# Patient Record
Sex: Male | Born: 1970 | Race: White | Hispanic: No | Marital: Single | Smoking: Current some day smoker
Health system: Southern US, Community
[De-identification: ages and names within clinical notes are randomized; demographics above are authoritative.]

## PROBLEM LIST (undated history)

## (undated) DIAGNOSIS — IMO0002 Reserved for concepts with insufficient information to code with codable children: Secondary | ICD-10-CM

## (undated) DIAGNOSIS — E1165 Type 2 diabetes mellitus with hyperglycemia: Secondary | ICD-10-CM

## (undated) DIAGNOSIS — E119 Type 2 diabetes mellitus without complications: Secondary | ICD-10-CM

## (undated) DIAGNOSIS — E162 Hypoglycemia, unspecified: Secondary | ICD-10-CM

## (undated) DIAGNOSIS — G2581 Restless legs syndrome: Secondary | ICD-10-CM

## (undated) DIAGNOSIS — L02611 Cutaneous abscess of right foot: Secondary | ICD-10-CM

## (undated) DIAGNOSIS — E1140 Type 2 diabetes mellitus with diabetic neuropathy, unspecified: Secondary | ICD-10-CM

## (undated) DIAGNOSIS — F1990 Other psychoactive substance use, unspecified, uncomplicated: Secondary | ICD-10-CM

## (undated) DIAGNOSIS — F329 Major depressive disorder, single episode, unspecified: Secondary | ICD-10-CM

## (undated) DIAGNOSIS — F319 Bipolar disorder, unspecified: Secondary | ICD-10-CM

## (undated) DIAGNOSIS — Z9119 Patient's noncompliance with other medical treatment and regimen: Secondary | ICD-10-CM

## (undated) DIAGNOSIS — A4902 Methicillin resistant Staphylococcus aureus infection, unspecified site: Secondary | ICD-10-CM

## (undated) HISTORY — DX: Major depressive disorder, single episode, unspecified: F32.9

## (undated) HISTORY — DX: Type 2 diabetes mellitus with hyperglycemia: E11.65

## (undated) HISTORY — DX: Cutaneous abscess of right foot: L02.611

## (undated) HISTORY — DX: Methicillin resistant Staphylococcus aureus infection, unspecified site: A49.02

## (undated) HISTORY — DX: Patient's noncompliance with other medical treatment and regimen: Z91.19

## (undated) HISTORY — DX: Hypoglycemia, unspecified: E16.2

## (undated) HISTORY — DX: Other psychoactive substance use, unspecified, uncomplicated: F19.90

## (undated) HISTORY — DX: Restless legs syndrome: G25.81

## (undated) HISTORY — DX: Type 2 diabetes mellitus with diabetic neuropathy, unspecified: E11.40

## (undated) HISTORY — DX: Bipolar disorder, unspecified: F31.9

## (undated) HISTORY — DX: Reserved for concepts with insufficient information to code with codable children: IMO0002

---

## 2005-01-14 ENCOUNTER — Emergency Department (HOSPITAL_COMMUNITY): Admission: EM | Admit: 2005-01-14 | Discharge: 2005-01-14 | Payer: Self-pay | Admitting: Emergency Medicine

## 2008-05-30 ENCOUNTER — Ambulatory Visit: Payer: Self-pay | Admitting: Family Medicine

## 2008-05-30 DIAGNOSIS — E1165 Type 2 diabetes mellitus with hyperglycemia: Secondary | ICD-10-CM

## 2008-05-30 DIAGNOSIS — F329 Major depressive disorder, single episode, unspecified: Secondary | ICD-10-CM

## 2008-05-30 DIAGNOSIS — F3289 Other specified depressive episodes: Secondary | ICD-10-CM

## 2008-05-30 DIAGNOSIS — F319 Bipolar disorder, unspecified: Secondary | ICD-10-CM

## 2008-05-30 DIAGNOSIS — IMO0001 Reserved for inherently not codable concepts without codable children: Secondary | ICD-10-CM

## 2008-05-30 HISTORY — DX: Bipolar disorder, unspecified: F31.9

## 2008-05-30 HISTORY — DX: Major depressive disorder, single episode, unspecified: F32.9

## 2008-05-30 HISTORY — DX: Other specified depressive episodes: F32.89

## 2008-05-30 HISTORY — DX: Reserved for inherently not codable concepts without codable children: IMO0001

## 2008-06-02 ENCOUNTER — Telehealth (INDEPENDENT_AMBULATORY_CARE_PROVIDER_SITE_OTHER): Payer: Self-pay | Admitting: *Deleted

## 2008-06-02 LAB — CONVERTED CEMR LAB
ALT: 55 units/L — ABNORMAL HIGH (ref 0–53)
Albumin: 3.9 g/dL (ref 3.5–5.2)
Alkaline Phosphatase: 66 units/L (ref 39–117)
BUN: 19 mg/dL (ref 6–23)
Bilirubin, Direct: 0.1 mg/dL (ref 0.0–0.3)
CO2: 29 meq/L (ref 19–32)
Calcium: 9.8 mg/dL (ref 8.4–10.5)
Eosinophils Relative: 1.6 % (ref 0.0–5.0)
GFR calc Af Amer: 89 mL/min
Glucose, Bld: 414 mg/dL — ABNORMAL HIGH (ref 70–99)
HCT: 49.7 % (ref 39.0–52.0)
Hemoglobin: 17.3 g/dL — ABNORMAL HIGH (ref 13.0–17.0)
Lymphocytes Relative: 20.5 % (ref 12.0–46.0)
Monocytes Absolute: 0.6 10*3/uL (ref 0.1–1.0)
Monocytes Relative: 9.7 % (ref 3.0–12.0)
Neutro Abs: 4.5 10*3/uL (ref 1.4–7.7)
Potassium: 5 meq/L (ref 3.5–5.1)
RBC: 5.53 M/uL (ref 4.22–5.81)
RDW: 11.2 % — ABNORMAL LOW (ref 11.5–14.6)
Sodium: 134 meq/L — ABNORMAL LOW (ref 135–145)
Total CHOL/HDL Ratio: 12.8
Total Protein: 7.2 g/dL (ref 6.0–8.3)
VLDL: 86 mg/dL — ABNORMAL HIGH (ref 0–40)
WBC: 6.5 10*3/uL (ref 4.5–10.5)

## 2008-06-03 ENCOUNTER — Telehealth: Payer: Self-pay | Admitting: Family Medicine

## 2008-06-06 ENCOUNTER — Encounter: Payer: Self-pay | Admitting: Family Medicine

## 2008-06-09 ENCOUNTER — Encounter: Payer: Self-pay | Admitting: Family Medicine

## 2008-06-17 ENCOUNTER — Encounter: Payer: Self-pay | Admitting: Family Medicine

## 2008-11-30 ENCOUNTER — Emergency Department (HOSPITAL_COMMUNITY): Admission: EM | Admit: 2008-11-30 | Discharge: 2008-11-30 | Payer: Self-pay | Admitting: Emergency Medicine

## 2010-10-19 LAB — DIFFERENTIAL
Basophils Absolute: 0 10*3/uL (ref 0.0–0.1)
Basophils Relative: 0 % (ref 0–1)
Eosinophils Absolute: 0.1 10*3/uL (ref 0.0–0.7)
Eosinophils Relative: 2 % (ref 0–5)
Neutrophils Relative %: 60 % (ref 43–77)

## 2010-10-19 LAB — BASIC METABOLIC PANEL
BUN: 8 mg/dL (ref 6–23)
Chloride: 101 mEq/L (ref 96–112)
Creatinine, Ser: 1.06 mg/dL (ref 0.4–1.5)
Glucose, Bld: 415 mg/dL — ABNORMAL HIGH (ref 70–99)
Potassium: 3.8 mEq/L (ref 3.5–5.1)

## 2010-10-19 LAB — CBC
HCT: 39.8 % (ref 39.0–52.0)
MCHC: 34.6 g/dL (ref 30.0–36.0)
MCV: 89.7 fL (ref 78.0–100.0)
Platelets: 246 10*3/uL (ref 150–400)
RDW: 12.4 % (ref 11.5–15.5)

## 2012-06-16 DIAGNOSIS — Z91199 Patient's noncompliance with other medical treatment and regimen due to unspecified reason: Secondary | ICD-10-CM

## 2012-06-16 DIAGNOSIS — IMO0002 Reserved for concepts with insufficient information to code with codable children: Secondary | ICD-10-CM

## 2012-06-16 DIAGNOSIS — Z9119 Patient's noncompliance with other medical treatment and regimen: Secondary | ICD-10-CM

## 2012-06-16 DIAGNOSIS — F1990 Other psychoactive substance use, unspecified, uncomplicated: Secondary | ICD-10-CM

## 2012-06-16 HISTORY — DX: Other psychoactive substance use, unspecified, uncomplicated: F19.90

## 2012-06-16 HISTORY — DX: Patient's noncompliance with other medical treatment and regimen due to unspecified reason: Z91.199

## 2012-06-16 HISTORY — DX: Patient's noncompliance with other medical treatment and regimen: Z91.19

## 2012-06-16 HISTORY — DX: Reserved for concepts with insufficient information to code with codable children: IMO0002

## 2012-06-21 DIAGNOSIS — A4902 Methicillin resistant Staphylococcus aureus infection, unspecified site: Secondary | ICD-10-CM

## 2012-06-21 HISTORY — DX: Methicillin resistant Staphylococcus aureus infection, unspecified site: A49.02

## 2012-08-10 DIAGNOSIS — E1140 Type 2 diabetes mellitus with diabetic neuropathy, unspecified: Secondary | ICD-10-CM

## 2012-08-10 HISTORY — DX: Type 2 diabetes mellitus with diabetic neuropathy, unspecified: E11.40

## 2012-08-14 DIAGNOSIS — G2581 Restless legs syndrome: Secondary | ICD-10-CM

## 2012-08-14 HISTORY — DX: Restless legs syndrome: G25.81

## 2013-01-20 DIAGNOSIS — E162 Hypoglycemia, unspecified: Secondary | ICD-10-CM

## 2013-01-20 HISTORY — DX: Hypoglycemia, unspecified: E16.2

## 2016-08-11 DIAGNOSIS — L02611 Cutaneous abscess of right foot: Secondary | ICD-10-CM

## 2016-08-11 HISTORY — DX: Cutaneous abscess of right foot: L02.611

## 2017-03-29 ENCOUNTER — Encounter (HOSPITAL_BASED_OUTPATIENT_CLINIC_OR_DEPARTMENT_OTHER): Payer: Self-pay | Admitting: Emergency Medicine

## 2017-03-29 ENCOUNTER — Emergency Department (HOSPITAL_BASED_OUTPATIENT_CLINIC_OR_DEPARTMENT_OTHER): Payer: BLUE CROSS/BLUE SHIELD

## 2017-03-29 ENCOUNTER — Emergency Department (HOSPITAL_BASED_OUTPATIENT_CLINIC_OR_DEPARTMENT_OTHER)
Admission: EM | Admit: 2017-03-29 | Discharge: 2017-03-29 | Disposition: A | Discharge status: Home / Self Care | Payer: BLUE CROSS/BLUE SHIELD | Attending: Emergency Medicine | Admitting: Emergency Medicine

## 2017-03-29 DIAGNOSIS — E119 Type 2 diabetes mellitus without complications: Secondary | ICD-10-CM | POA: Insufficient documentation

## 2017-03-29 DIAGNOSIS — R072 Precordial pain: Secondary | ICD-10-CM | POA: Diagnosis not present

## 2017-03-29 DIAGNOSIS — Z794 Long term (current) use of insulin: Secondary | ICD-10-CM | POA: Insufficient documentation

## 2017-03-29 DIAGNOSIS — F17200 Nicotine dependence, unspecified, uncomplicated: Secondary | ICD-10-CM | POA: Insufficient documentation

## 2017-03-29 DIAGNOSIS — R200 Anesthesia of skin: Secondary | ICD-10-CM | POA: Insufficient documentation

## 2017-03-29 DIAGNOSIS — R079 Chest pain, unspecified: Secondary | ICD-10-CM | POA: Diagnosis present

## 2017-03-29 DIAGNOSIS — Z79899 Other long term (current) drug therapy: Secondary | ICD-10-CM | POA: Diagnosis not present

## 2017-03-29 HISTORY — DX: Type 2 diabetes mellitus without complications: E11.9

## 2017-03-29 LAB — CBG MONITORING, ED: GLUCOSE-CAPILLARY: 305 mg/dL — AB (ref 65–99)

## 2017-03-29 LAB — COMPREHENSIVE METABOLIC PANEL
ALBUMIN: 4.2 g/dL (ref 3.5–5.0)
ALT: 29 U/L (ref 17–63)
AST: 31 U/L (ref 15–41)
Alkaline Phosphatase: 66 U/L (ref 38–126)
Anion gap: 15 (ref 5–15)
BUN: 13 mg/dL (ref 6–20)
CHLORIDE: 97 mmol/L — AB (ref 101–111)
CO2: 19 mmol/L — ABNORMAL LOW (ref 22–32)
Calcium: 9.1 mg/dL (ref 8.9–10.3)
Creatinine, Ser: 0.84 mg/dL (ref 0.61–1.24)
GFR calc Af Amer: >60 mL/min (ref >60)
Glucose, Bld: 302 mg/dL — ABNORMAL HIGH (ref 65–99)
POTASSIUM: 4 mmol/L (ref 3.5–5.1)
SODIUM: 131 mmol/L — AB (ref 135–145)
Total Bilirubin: 1 mg/dL (ref 0.3–1.2)
Total Protein: 7.1 g/dL (ref 6.5–8.1)

## 2017-03-29 LAB — CBC
HCT: 37.7 % — ABNORMAL LOW (ref 39.0–52.0)
Hemoglobin: 13.5 g/dL (ref 13.0–17.0)
MCH: 32 pg (ref 26.0–34.0)
MCHC: 35.8 g/dL (ref 30.0–36.0)
MCV: 89.3 fL (ref 78.0–100.0)
PLATELETS: 204 10*3/uL (ref 150–400)
RBC: 4.22 MIL/uL (ref 4.22–5.81)
RDW: 11.4 % — ABNORMAL LOW (ref 11.5–15.5)
WBC: 7.7 10*3/uL (ref 4.0–10.5)

## 2017-03-29 LAB — D-DIMER, QUANTITATIVE (NOT AT ARMC)

## 2017-03-29 LAB — TROPONIN I: Troponin I: <0.03 ng/mL (ref <0.03)

## 2017-03-29 MED ORDER — MORPHINE SULFATE (PF) 4 MG/ML IV SOLN
4.0000 mg | Freq: Once | INTRAVENOUS | Status: AC
  Administered 2017-03-29: 4 mg via INTRAVENOUS
  Filled 2017-03-29: qty 1

## 2017-03-29 NOTE — Discharge Instructions (Signed)

## 2017-03-29 NOTE — ED Provider Notes (Signed)
[facility]   CSN: [id number] Arrival date & time: 03/29/17  1022     History   Chief Complaint Chief Complaint  Patient presents with  . Chest Pain    HPI Lance Clark is a 46 y.o. male with past medical history of diabetes who presents with chest pain that began at approximately 8 AM this morning. Patient reports that when he woke up he was experiencing some "dull pressure" to the midsternal area that radiates just the left side. He reports that pain is a 5/10. He reports feeling slightly diaphoretic at onset of pain but no nausea. He reports that pain is worsened with exertion and deep inspiration. Patient reports that he thought he was having a panic attack or was a result of exercising yesterday and so he went to work as normal. Patient states that he did engage in strenuous exercise yesterday that of triceps and pec workout. Patient reports that once he got to work he started having some shortness of breath that was worse with exertion and deep inspiration. Patient also notes that his girlfriend was concerned that there might be some slurred speech and states that no bee at work noticed it. Patient also reports that he is having numbness in his bilateral hands and bilateral lower extremities.  Patient does report doing 4 lines of cocaine last night. He also reports that he smoked 3 cigarettes last night. Patient reports that he intermittently smokes. Patient states that his grandfather had a heart attack and his uncle had a heart attack in his late 34s. Patient denies any personal cardiac history. Patient denies any recent fever, chills, abdominal pain, nausea/vomiting. She does report that he recently drove 12 hours from [place]. He denies any hormone use, recent immobilization, prior history of DVT/PE, recent surgery, leg swelling.   The history is provided by the patient.    Past Medical History:  Diagnosis Date  . Diabetes mellitus without complication  [facility])     Patient Active Problem List   Diagnosis Date Noted  . DIABETES MELLITUS, TYPE II, UNCONTROLLED 05/30/2008  . BIPOLAR DISORDER UNSPECIFIED 05/30/2008  . DEPRESSION 05/30/2008    History reviewed. No pertinent surgical history.     Home Medications    Prior to Admission medications   Medication Sig Start Date End Date Taking? Authorizing Provider  gabapentin (NEURONTIN) 100 MG capsule Take 600 mg by mouth 3 (three) times daily.   Yes [provider]  insulin detemir (LEVEMIR) 100 UNIT/ML injection Inject 40 Units into the skin 2 (two) times daily.   Yes [provider]  insulin lispro (HUMALOG) 100 UNIT/ML injection Inject into the skin 3 (three) times daily before meals.   Yes [provider]    Family History History reviewed. No pertinent family history.  Social History Social History  Substance Use Topics  . Smoking status: Current Some Day Smoker  . Smokeless tobacco: Never Used  . Alcohol use Yes     Allergies   Patient has no known allergies.   Review of Systems Review of Systems  Constitutional: Negative for fever.  Eyes: Positive for visual disturbance.  Respiratory: Positive for chest tightness and shortness of breath. Negative for cough.   Cardiovascular: Positive for chest pain. Negative for leg swelling.  Gastrointestinal: Negative for abdominal pain, diarrhea, nausea and vomiting.  Genitourinary: Negative for dysuria and hematuria.  Musculoskeletal: Negative for back pain and neck pain.  Skin: Negative for rash.  Neurological: Positive for speech difficulty and  numbness. Negative for dizziness, weakness and headaches.     Physical Exam Updated Vital Signs BP (!) 129/94   Pulse 100   Temp 98.1 F (36.7 C) (Oral)   Resp 17   Ht  (1.854 m)   Wt 99.8 kg (220 lb)   SpO2 99%   BMI 29.03 kg/m   Physical Exam  Constitutional: He is oriented to person, place, and time. He appears well-developed and  well-nourished.  Appears anxious  HENT:  Head: Normocephalic and atraumatic.  Mouth/Throat: Oropharynx is clear and moist and mucous membranes are normal.  Eyes: Pupils are equal, round, and reactive to light. Conjunctivae, EOM and lids are normal.  Neck: Full passive range of motion without pain.  Cardiovascular: Regular rhythm, normal heart sounds and normal pulses.  Tachycardia present.  Exam reveals no gallop and no friction rub.   No murmur heard. Pulmonary/Chest: Effort normal and breath sounds normal.  No evidence of respiratory distress. Able to speak in full sentences without difficulty. No anterior chest wall tenderness. No deformity or crepitus.   Abdominal: Soft. Normal appearance. There is no tenderness. There is no rigidity and no guarding.  Musculoskeletal: Normal range of motion.  Neurological: He is alert and oriented to person, place, and time.  Cranial nerves III-XII intact Follows commands, Moves all extremities  5/5 strength to BUE and BLE  Reports decreased sensation to bilateral hands and legs  Normal finger to nose. No pronator drift. No gait abnormalities  No slurred speech. No facial droop.   Skin: Skin is warm and dry. Capillary refill takes less than 2 seconds.  Psychiatric: He has a normal mood and affect. His speech is normal.  Nursing note and vitals reviewed.    ED Treatments / Results  Labs (all labs ordered are listed, but only abnormal results are displayed) Labs Reviewed  CBC - Abnormal; Notable for the following:       Result Value   HCT 37.7 (*)    RDW 11.4 (*)    All other components within normal limits  COMPREHENSIVE METABOLIC PANEL - Abnormal; Notable for the following:    Sodium 131 (*)    Chloride 97 (*)    CO2 19 (*)    Glucose, Bld 302 (*)    All other components within normal limits  CBG MONITORING, ED - Abnormal; Notable for the following:    Glucose-Capillary 305 (*)    All other components within normal limits  TROPONIN I   D-DIMER, QUANTITATIVE (NOT AT [facility])  TROPONIN I    EKG  EKG Interpretation  Date/Time:  Wednesday March 29 2017 11:08:03 EDT Ventricular Rate:  99 PR Interval:    QRS Duration: 79 QT Interval:  363 QTC Calculation: 466 R Axis:   65 Text Interpretation:  Sinus rhythm No STEMI.  Confirmed by Alona Bene [phone]) on 03/29/2017 11:42:03 AM       Radiology Dg Chest 2 View  Result Date: 03/29/2017 CLINICAL DATA:  Chest pain. EXAM: CHEST  2 VIEW COMPARISON:  None. FINDINGS: Normal heart size and mediastinal contours. No acute infiltrate or edema. No effusion or pneumothorax. No acute osseous findings. EKG leads create artifact over the chest on the AP view. IMPRESSION: Negative chest. Electronically Signed   By: Marnee Spring M.D.   On: 03/29/2017 11:04   Ct Head Wo Contrast  Result Date: 03/29/2017 CLINICAL DATA:  Numbness or tingling, paresthesias. Tingling in the hands. EXAM: CT HEAD WITHOUT CONTRAST TECHNIQUE: Contiguous axial images were obtained  from the base of the skull through the vertex without intravenous contrast. COMPARISON:  None. FINDINGS: Brain: No evidence of acute infarction, hemorrhage, hydrocephalus, extra-axial collection or mass. Developmental retro cerebellar CSF accumulation without mass effect. Vascular: No hyperdense vessel or unexpected calcification. Skull: No acute or aggressive finding Sinuses/Orbits: Negative IMPRESSION: Negative head CT. Electronically Signed   By: Marnee Spring M.D.   On: 03/29/2017 11:06    Procedures Procedures (including critical care time)  Medications Ordered in ED Medications  morphine 4 MG/ML injection 4 mg (4 mg Intravenous Given 03/29/17 1107)     Initial Impression / Assessment and Plan / ED Course  I have reviewed the triage vital signs and the nursing notes.  Pertinent labs & imaging results that were available during my care of the patient were reviewed by me and considered in my medical decision making (see  chart for details).     46 year old male who presents with chest pain that began at 8 AM this morning. Also reports some bilateral hand and lower extremity numbness. States girlfriend noticed some slurred speech. History of cocaine use yesterday. Patient is afebrile, non-toxic appearing. Vital signs reviewed and stable. Consider ACS versus cocaine induced MI versus acute infectious etiology versus anxiety versus musculoskeletal pain. Low suspicion for CVA given distribution of symptoms and normal neuro exam. Likely result of anxiety. Will plan to get a CT head for further evaluation. We'll plan to check basic labs including CBC, CMP, EKG, troponin, chest x-ray, CT head. Analgesics provided in the department.  Labs and imaging reviewed. CMP shows hyperglycemia at 302 otherwise unremarkable. CBC within normal limits. D-dimer is negative. Troponin is negative. Chest x-ray shows no infectious etiology. EKG shows sinus tach rate 107. No ST elevations.  Given patient's history/physical exam and current presentation, he has a heart score of 4. Discussed patient with Dr. Jacqulyn Bath. Given presentation, patient can have a delta troponin for rule out cardiac etiology. Updated patient on plan. Reevaluation after pain medications. He reports that the pain is improved to 1/10. Patient is requesting to eat in the department.  Reevaluation. Patient reports this pain has completely resolved. His numbness to the bilateral hands and legs have improved. Do not feel like patient needs an emergent MRI for CVA rule out. He has been able to tolerate by mouth in the department without any difficulty. Delta troponin pending.  Repeat troponin is negative. Dr. Jacqulyn Bath discussed with patient. Patient is hemodynamically stable for discharge at this time. Strict return precautions discussed. Patient expresses understanding and agreement to plan.    Final Clinical Impressions(s) / ED Diagnoses   Final diagnoses:  Precordial chest pain     New Prescriptions Discharge Medication List as of 03/29/2017  2:48 PM       Maxwell Caul, PA-C 04/02/17 1519    Long, Arlyss Repress, MD 04/03/17 1311

## 2017-03-29 NOTE — ED Triage Notes (Addendum)
Patient reports chest pain which began this morning at 0800 when he got to work.  Reports shortness of breath.  States tingling in hands as well.  Reports he also felt like his speech is slurred.

## 2017-03-29 NOTE — ED Notes (Signed)
ED Provider at bedside. 

## 2017-03-29 NOTE — ED Notes (Signed)
Second trop drawn, pt denies any pain, requesting something to eat and drink. TV dinner and diet coke given

## 2017-03-29 NOTE — ED Notes (Signed)
Oxygen discontinued-oxygen saturation %

## 2017-03-29 NOTE — ED Notes (Signed)
Oxygen @ 90% on RA.  Placed on 2L via [place].  Oxygen at 93%.

## 2017-05-01 ENCOUNTER — Encounter: Payer: Self-pay | Admitting: *Deleted

## 2017-05-03 ENCOUNTER — Other Ambulatory Visit: Payer: Self-pay | Admitting: *Deleted

## 2017-05-08 ENCOUNTER — Ambulatory Visit: Payer: BLUE CROSS/BLUE SHIELD | Admitting: Cardiology

## 2018-07-15 IMAGING — CT CT HEAD W/O CM
3 series · 16 of 47 positions shown, 19 images · non-contrast
Comparison: None.

CLINICAL DATA: Numbness or tingling, paresthesias. Tingling in the
hands.

EXAM:
CT HEAD WITHOUT CONTRAST
TECHNIQUE: Contiguous axial images were obtained from the base of the skull
through the vertex without intravenous contrast.

[Series 2: head wo · axial · 0.44mm/px · z∈[-145,-0]mm · 10 of 35 slices shown, 13 images]
[im 3/35  brain]
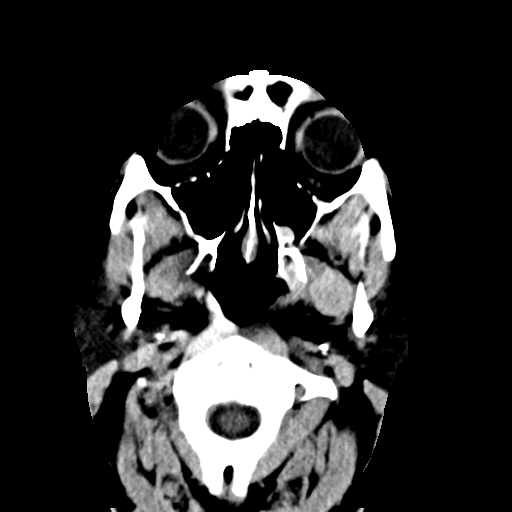
[im 3/35  bone]
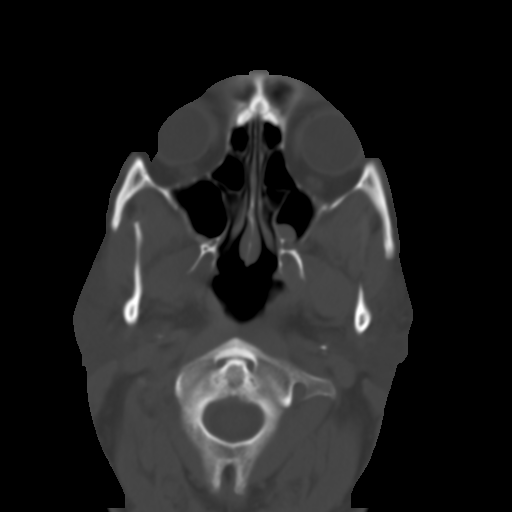
[im 6/35  brain]
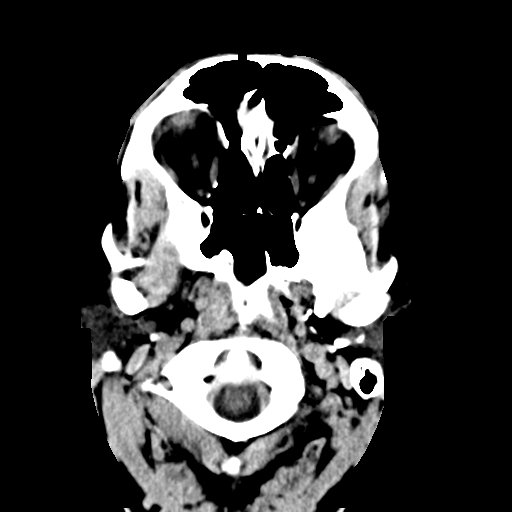
[im 10/35  brain]
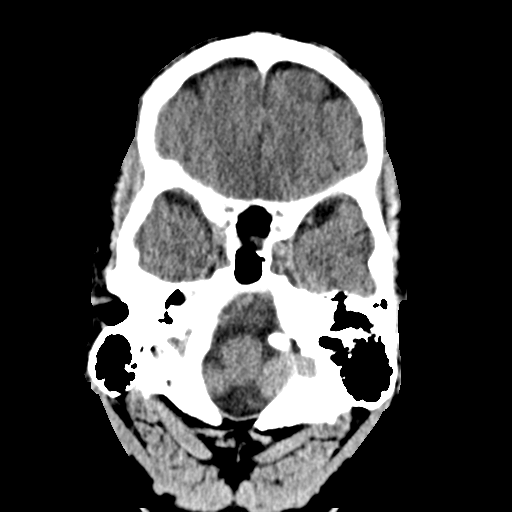
[im 12/35  brain]
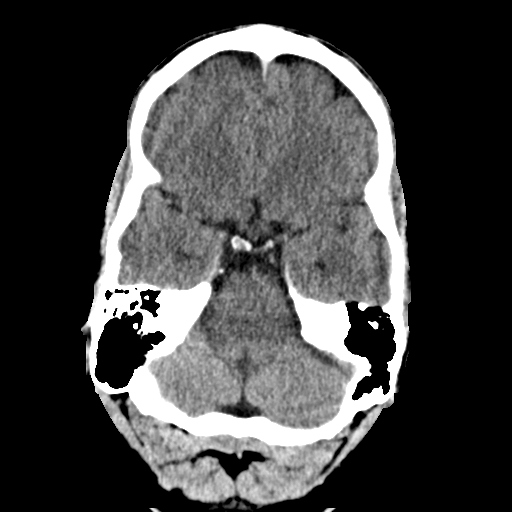
[im 16/35  brain]
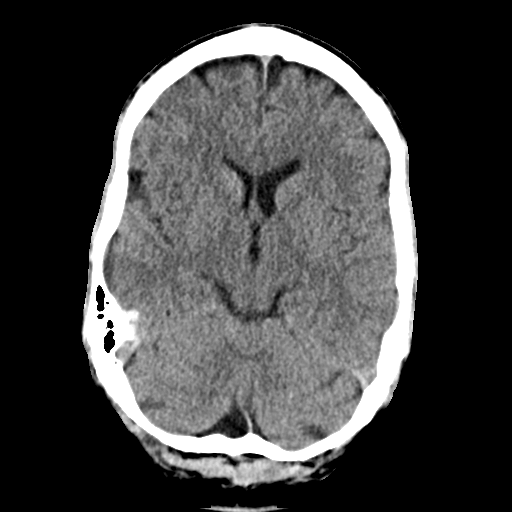
[im 16/35  bone]
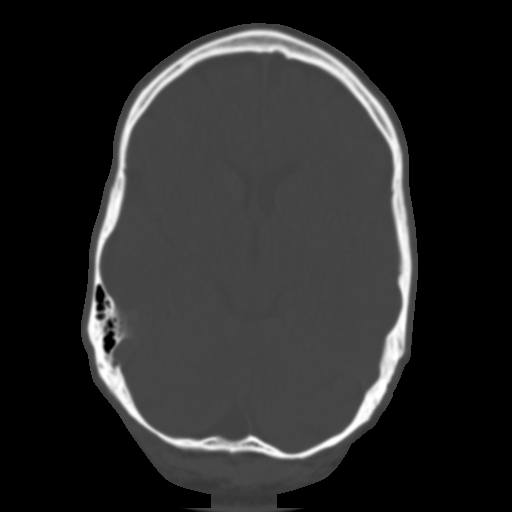
[im 19/35  brain]
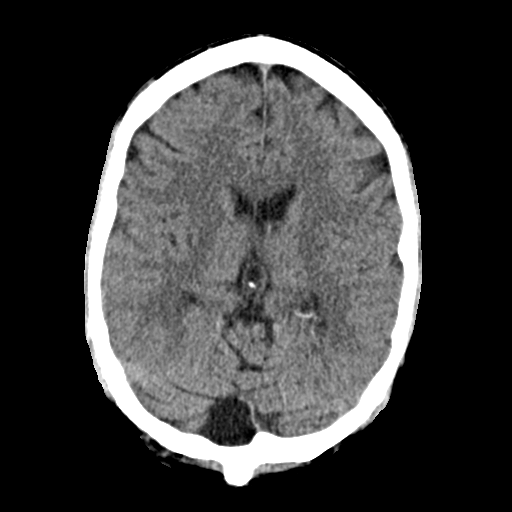
[im 23/35  brain]
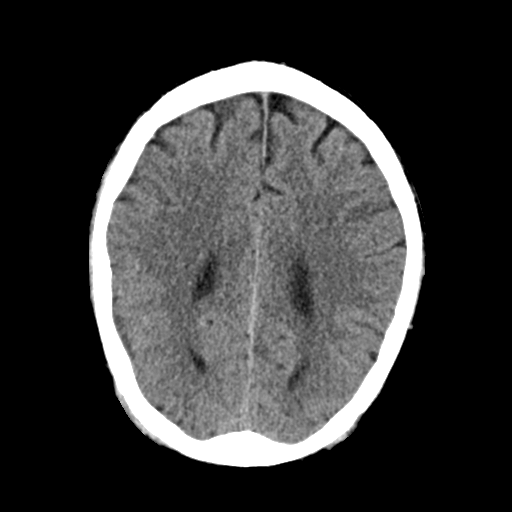
[im 26/35  brain]
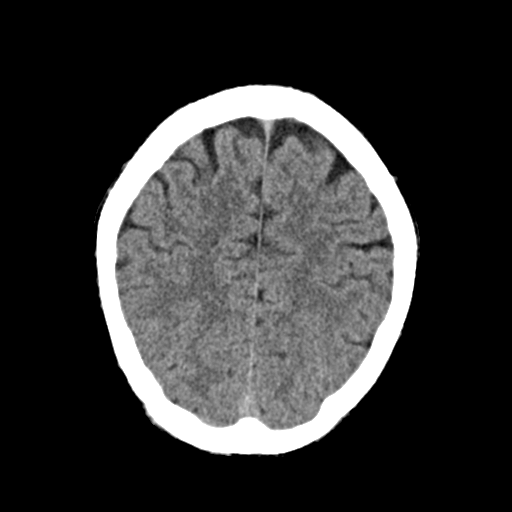
[im 29/35  brain]
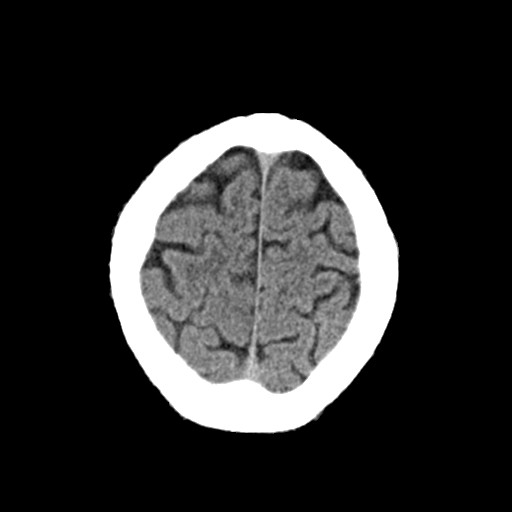
[im 29/35  bone]
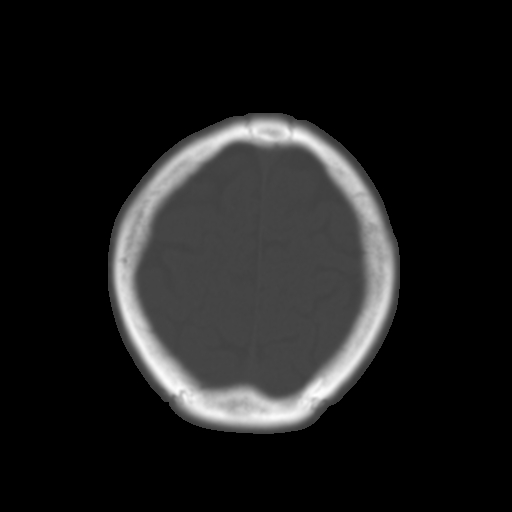
[im 32/35  brain]
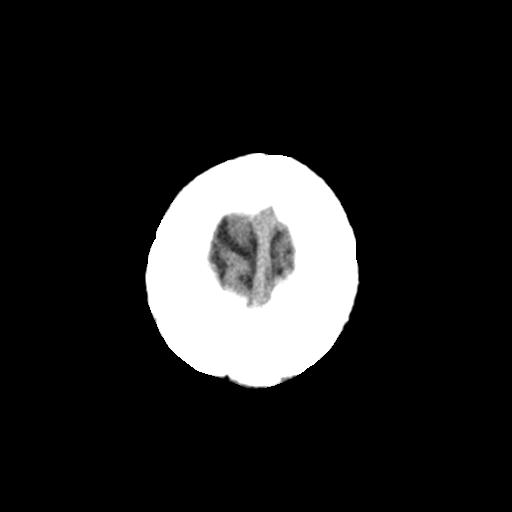

[Series 4: coronal soft · coronal · 0.33mm/px · 3 of 74 slices shown]
[im 25/74  brain]
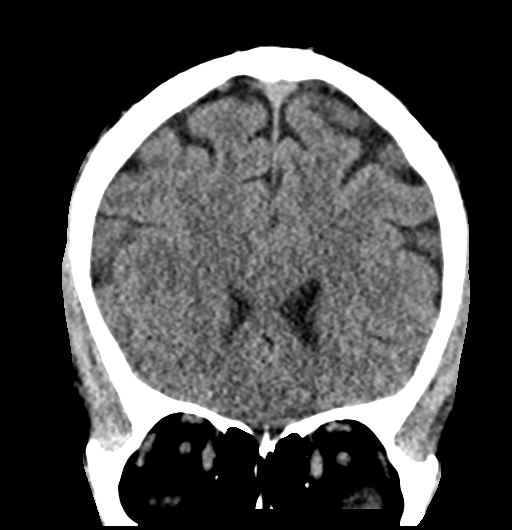
[im 33/74  brain]
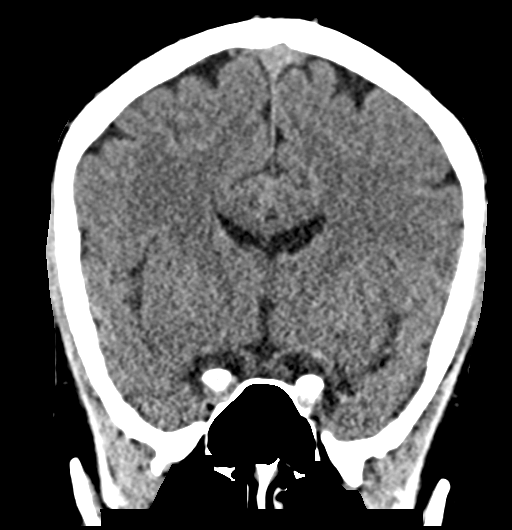
[im 41/74  brain]
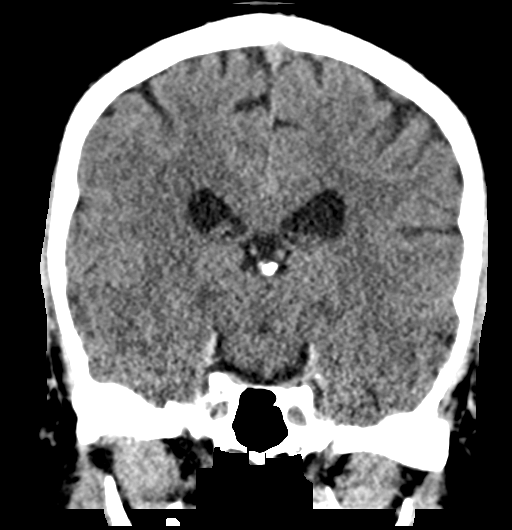

[Series 5: sag soft · sagittal · 0.34mm/px · 3 of 55 slices shown]
[im 19/55  brain]
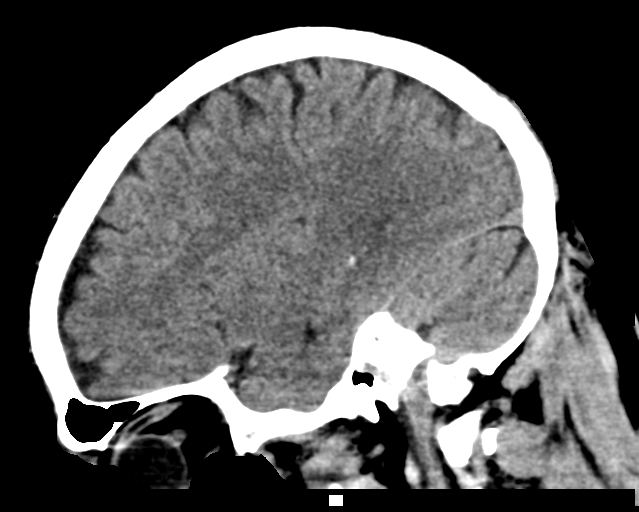
[im 28/55  brain]
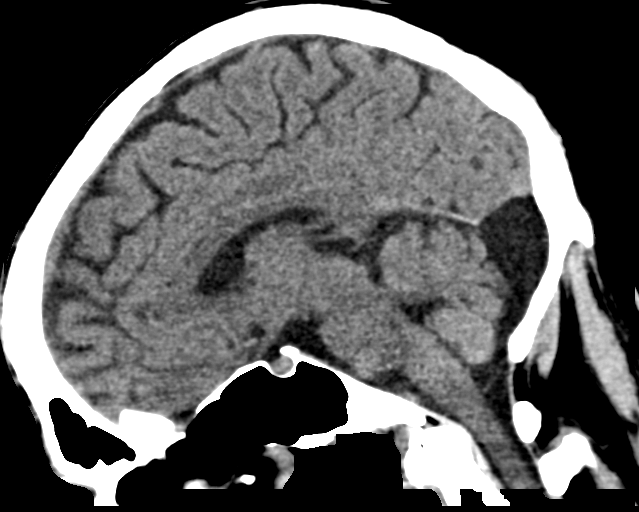
[im 37/55  brain]
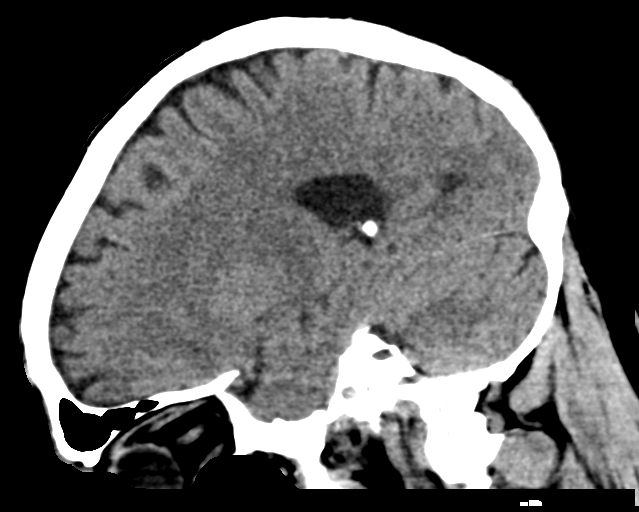

[16 of 47 positions shown; findings below may reference images not displayed]

FINDINGS: Brain: No evidence of acute infarction, hemorrhage, hydrocephalus,
extra-axial collection or mass. Developmental retro cerebellar CSF
accumulation without mass effect.

Vascular: No hyperdense vessel or unexpected calcification.

Skull: No acute or aggressive finding

Sinuses/Orbits: Negative
IMPRESSION: Negative head CT.

## 2022-02-08 LAB — EXTERNAL GENERIC LAB PROCEDURE: COLOGUARD: NEGATIVE
